# Patient Record
Sex: Male | Born: 2000 | Race: Black or African American | Hispanic: No | Marital: Single | Smoking: Never smoker
Health system: Southern US, Community
[De-identification: ages and names within clinical notes are randomized; demographics above are authoritative.]

## PROBLEM LIST (undated history)

## (undated) DIAGNOSIS — T148XXA Other injury of unspecified body region, initial encounter: Secondary | ICD-10-CM

## (undated) DIAGNOSIS — J302 Other seasonal allergic rhinitis: Secondary | ICD-10-CM

## (undated) DIAGNOSIS — J45909 Unspecified asthma, uncomplicated: Secondary | ICD-10-CM

## (undated) HISTORY — PX: WRIST SURGERY: SHX841

---
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
## 2022-11-12 ENCOUNTER — Ambulatory Visit
Admission: EM | Admit: 2022-11-12 | Discharge: 2022-11-12 | Disposition: A | Discharge status: Home / Self Care | Payer: Medicaid Other | Attending: Internal Medicine | Admitting: Internal Medicine

## 2022-11-12 ENCOUNTER — Encounter: Payer: Self-pay | Admitting: Emergency Medicine

## 2022-11-12 DIAGNOSIS — R509 Fever, unspecified: Secondary | ICD-10-CM

## 2022-11-12 DIAGNOSIS — R109 Unspecified abdominal pain: Secondary | ICD-10-CM | POA: Diagnosis not present

## 2022-11-12 DIAGNOSIS — R112 Nausea with vomiting, unspecified: Secondary | ICD-10-CM | POA: Diagnosis not present

## 2022-11-12 MED ORDER — ONDANSETRON 4 MG PO TBDP: 4.0000 mg | ORAL_TABLET | Freq: Three times a day (TID) | ORAL | 0 refills | Status: AC | PRN

## 2022-11-12 NOTE — ED Provider Notes (Signed)
[facility]    CSN: [id number] Arrival date & time: 11/12/22  0945      History   Chief Complaint Chief Complaint  Patient presents with   Abdominal Pain    HPI Kevin Boyer is a 22 y.o. male.   Patient presents with nausea, vomiting, abdominal discomfort that started this morning upon awakening.  Patient last bowel movement was this morning and patient denies blood in stool or emesis.  Denies any fever at home.  Reports no obvious known sick contacts but [place] around a lot of people recently for a funeral.  Patient reports that he had cough and nasal congestion at start of the week which is now resolved.  Reports "aching" abdominal discomfort in the mid lower portion of the abdomen.  Reports it is constant.  Denies any recent unfavorable foods or travel outside the [place].  Denies history of chronic gastrointestinal problems.   Abdominal Pain   Past Medical History:  Diagnosis Date   Asthma    Fracture    right wrist   Seasonal allergies     There are no problems to display for this patient.   Past Surgical History:  Procedure Laterality Date   WRIST SURGERY Right        Home Medications    Prior to Admission medications   Medication Sig Start Date End Date Taking? Authorizing Provider  ondansetron (ZOFRAN-ODT) 4 MG disintegrating tablet Take 1 tablet (4 mg total) by mouth every 8 (eight) hours as needed for nausea or vomiting. 11/12/22  Yes Evelin Cake, Michele Rockers, FNP  ibuprofen (ADVIL,MOTRIN) 600 MG tablet Take 1 tablet (600 mg total) by mouth every 6 (six) hours as needed for mild pain or moderate pain. Patient not taking: Reported on 04/18/2017 11/06/16   Jean Rosenthal, NP  ondansetron (ZOFRAN) 4 MG tablet Take 1 tablet (4 mg total) by mouth every 6 (six) hours. Patient not taking: Reported on 04/18/2017 06/28/16   Shary Decamp, PA-C  oxyCODONE (ROXICODONE) 5 MG immediate release tablet Take 1 tablet  (5 mg total) by mouth every 6 (six) hours as needed for severe pain. Patient not taking: Reported on 04/18/2017 06/28/16   Shary Decamp, PA-C    Family History History reviewed. No pertinent family history.  Social History Social History   Tobacco Use   Smoking status: Never    Passive exposure: Yes   Smokeless tobacco: Never  Vaping Use   Vaping Use: Never used  Substance Use Topics   Alcohol use: Never   Drug use: Never     Allergies   Patient has no known allergies.   Review of Systems Review of Systems Per HPI  Physical Exam Triage Vital Signs ED Triage Vitals  Enc Vitals Group     BP 11/12/22 0953 (!) 146/99     Pulse Rate 11/12/22 0953 (!) 108     Resp 11/12/22 0953 18     Temp 11/12/22 0953 100.2 F (37.9 C)     Temp Source 11/12/22 0953 Oral     SpO2 11/12/22 0953 96 %     Weight 11/12/22 0956 (!) 380 lb (172.4 kg)     Height 11/12/22 0956 6\' 1"  (1.854 m)     Head Circumference --      Peak Flow --      Pain Score 11/12/22 0956 4     Pain Loc --      Pain Edu? --  Excl. in GC? --    No data found.  Updated Vital Signs BP (!) 146/99 (BP Location: Left Arm)   Pulse (!) 108   Temp 100.2 F (37.9 C) (Oral)   Resp 18   Ht 6\' 1"  (1.854 m)   Wt (!) 380 lb (172.4 kg)   SpO2 96%   BMI 50.13 kg/m   Visual Acuity Right Eye Distance:   Left Eye Distance:   Bilateral Distance:    Right Eye Near:   Left Eye Near:    Bilateral Near:     Physical Exam Constitutional:      General: He is not in acute distress.    Appearance: Normal appearance. He is diaphoretic. He is not toxic-appearing.  HENT:     Head: Normocephalic and atraumatic.  Eyes:     Extraocular Movements: Extraocular movements intact.     Conjunctiva/sclera: Conjunctivae normal.  Cardiovascular:     Rate and Rhythm: Regular rhythm. Tachycardia present.     Pulses: Normal pulses.     Heart sounds: Normal heart sounds.  Pulmonary:     Effort: Pulmonary effort is normal. No  respiratory distress.     Breath sounds: Normal breath sounds.  Abdominal:     General: Bowel sounds are normal. There is no distension.     Palpations: Abdomen is soft.     Tenderness: There is abdominal tenderness.       Comments: Patient has mild tenderness to abdomen to circled area on diagram.  Neurological:     General: No focal deficit present.     Mental Status: He is alert and oriented to person, place, and time. Mental status is at baseline.  Psychiatric:        Mood and Affect: Mood normal.        Behavior: Behavior normal.        Thought Content: Thought content normal.        Judgment: Judgment normal.      UC Treatments / Results  Labs (all labs ordered are listed, but only abnormal results are displayed) Labs Reviewed  CBC  COMPREHENSIVE METABOLIC PANEL    EKG   Radiology No results found.  Procedures Procedures (including critical care time)  Medications Ordered in UC Medications - No data to display  Initial Impression / Assessment and Plan / UC Course  I have reviewed the triage vital signs and the nursing notes.  Pertinent labs & imaging results that were available during my care of the patient were reviewed by me and considered in my medical decision making (see chart for details).     I am suspicious that patient could have a viral illness, but patient was advised to go to the emergency department for further evaluation and management given that I am concerned the patient needs imaging of the abdomen which cannot be provided here in urgent care.  Patient adamantly declined going to the ER.  Risk associated with not going the ER were discussed with patient.  Patient voiced understanding and accepted risks.  Given patient is declining go to the ER, will do limited workup here in urgent care.  CMP and CBC pending.  Ondansetron prescribed to take as needed for nausea.  Advised bland diet and ensuring adequate fluid hydration.  Patient was advised to go  to the ER if any symptoms persist or worsen.  Patient verbalized understanding and was agreeable with plan. Final Clinical Impressions(s) / UC Diagnoses   Final diagnoses:  Abdominal discomfort  Nausea and vomiting, unspecified vomiting type  Fever, unspecified     Discharge Instructions      I have prescribed you a nausea medication to take as needed.  Ensure adequate fluid hydration and bland diet. Blood work is pending.  Will call if it is abnormal.  Please go to emergency department if symptoms persist or worsen.    ED Prescriptions     Medication Sig Dispense Auth. Provider   ondansetron (ZOFRAN-ODT) 4 MG disintegrating tablet Take 1 tablet (4 mg total) by mouth every 8 (eight) hours as needed for nausea or vomiting. 20 tablet [place], Michele Rockers, [place]      PDMP not reviewed this encounter.   Teodora Medici, [place] 11/12/22 1031

## 2022-11-12 NOTE — Discharge Instructions (Addendum)
I have prescribed you a nausea medication to take as needed.  Ensure adequate fluid hydration and bland diet. Blood work is pending.  Will call if it is abnormal.  Please go to emergency department if symptoms persist or worsen.

## 2022-11-12 NOTE — ED Triage Notes (Signed)
Patient states that he ate something on yesterday, this morning he's developed epigastric pain, vomiting and headache this morning.  Patient did take Pepto Bismol this am.  Denies any diarrhea.

## 2022-11-13 LAB — COMPREHENSIVE METABOLIC PANEL
ALT: 13 IU/L (ref 0–44)
AST: 16 IU/L (ref 0–40)
Albumin/Globulin Ratio: 1.5 (ref 1.2–2.2)
Albumin: 4.5 g/dL (ref 4.3–5.2)
Alkaline Phosphatase: 80 IU/L (ref 51–125)
BUN/Creatinine Ratio: 10 (ref 9–20)
BUN: 11 mg/dL (ref 6–20)
Bilirubin Total: 0.3 mg/dL (ref 0.0–1.2)
CO2: 23 mmol/L (ref 20–29)
Calcium: 9.5 mg/dL (ref 8.7–10.2)
Chloride: 103 mmol/L (ref 96–106)
Creatinine, Ser: 1.05 mg/dL (ref 0.76–1.27)
Globulin, Total: 3.1 g/dL (ref 1.5–4.5)
Glucose: 112 mg/dL — ABNORMAL HIGH (ref 70–99)
Potassium: 4.6 mmol/L (ref 3.5–5.2)
Sodium: 142 mmol/L (ref 134–144)
Total Protein: 7.6 g/dL (ref 6.0–8.5)
eGFR: 105 mL/min/{1.73_m2} (ref >59)

## 2022-11-13 LAB — CBC
Hematocrit: 49.1 % (ref 37.5–51.0)
Hemoglobin: 16 g/dL (ref 13.0–17.7)
MCH: 27 pg (ref 26.6–33.0)
MCHC: 32.6 g/dL (ref 31.5–35.7)
MCV: 83 fL (ref 79–97)
Platelets: 277 10*3/uL (ref 150–450)
RBC: 5.93 x10E6/uL — ABNORMAL HIGH (ref 4.14–5.80)
RDW: 13.3 % (ref 11.6–15.4)
WBC: 6.2 10*3/uL (ref 3.4–10.8)
